# Patient Record
Sex: Female | Born: 1985 | Race: Black or African American | Hispanic: No | Marital: Single | Smoking: Current every day smoker
Health system: Southern US, Community
[De-identification: ages and names within clinical notes are randomized; demographics above are authoritative.]

---

## 2005-01-30 ENCOUNTER — Ambulatory Visit: Payer: Self-pay | Admitting: Internal Medicine

## 2005-03-07 ENCOUNTER — Emergency Department (HOSPITAL_COMMUNITY): Admission: EM | Admit: 2005-03-07 | Discharge: 2005-03-07 | Payer: Self-pay | Admitting: Emergency Medicine

## 2005-03-13 ENCOUNTER — Emergency Department (HOSPITAL_COMMUNITY): Admission: EM | Admit: 2005-03-13 | Discharge: 2005-03-14 | Payer: Self-pay | Admitting: Emergency Medicine

## 2017-02-22 ENCOUNTER — Emergency Department (HOSPITAL_COMMUNITY)
Admission: EM | Admit: 2017-02-22 | Discharge: 2017-02-22 | Disposition: A | Discharge status: Home / Self Care | Payer: Commercial Managed Care - HMO | Attending: Emergency Medicine | Admitting: Emergency Medicine

## 2017-02-22 ENCOUNTER — Encounter (HOSPITAL_COMMUNITY): Payer: Self-pay

## 2017-02-22 DIAGNOSIS — K0889 Other specified disorders of teeth and supporting structures: Secondary | ICD-10-CM | POA: Diagnosis present

## 2017-02-22 MED ORDER — LIDOCAINE VISCOUS 2 % MT SOLN
15.0000 mL | Freq: Once | OROMUCOSAL | Status: AC
  Administered 2017-02-22: 15 mL via OROMUCOSAL
  Filled 2017-02-22: qty 15

## 2017-02-22 MED ORDER — PENICILLIN V POTASSIUM 500 MG PO TABS: 500.0000 mg | ORAL_TABLET | Freq: Four times a day (QID) | ORAL | 0 refills | Status: AC

## 2017-02-22 MED ORDER — IBUPROFEN 800 MG PO TABS: 800.0000 mg | ORAL_TABLET | Freq: Three times a day (TID) | ORAL | 0 refills | Status: AC

## 2017-02-22 MED ORDER — BUPIVACAINE-EPINEPHRINE (PF) 0.5% -1:200000 IJ SOLN
1.8000 mL | Freq: Once | INTRAMUSCULAR | Status: AC
  Administered 2017-02-22: 1.8 mL
  Filled 2017-02-22: qty 1.8

## 2017-02-22 NOTE — ED Notes (Signed)
Pt departed in NAD, refused use of wheelchair.  

## 2017-02-22 NOTE — ED Notes (Signed)
ED Provider at bedside. 

## 2017-02-22 NOTE — ED Triage Notes (Signed)
Pt complaining of L lower dental pain x 1 week. Pt complaining of broken & missing teeth.

## 2017-02-22 NOTE — ED Notes (Signed)
Pt reports having taken  ibuprofen at home, approx 2000 tonight. Pt presents as anxious and tachypneic.

## 2017-02-22 NOTE — ED Provider Notes (Signed)
[facility]-EMERGENCY DEPT Provider Note   CSN: [id number] Arrival date & time: 02/22/17  0013     History   Chief Complaint Chief Complaint  Patient presents with  . Dental Pain    HPI Monica Owens is a 31 y.o. female who presents today with 1 week of progressively worsening left lower dental pain. Pain is sharp, constant, and does not radiate. Eating makes it worse. Ibuprofen has been minimally helpful. She states she has had this pain intermittently for 1 year now with known cracked tooth but it usually resolves. With associated HA and left sided ear pain. She has an appointment with dentist on Wednesday. Denies facial swelling, drainage, coughing, sneezing, congestion, CP/SOB, abd pain, n/v/d.   HPI  No past medical history on file.  There are no active problems to display for this patient.   No past surgical history on file.  OB History    No data available       Home Medications    Prior to Admission medications   Medication Sig Start Date End Date Taking? Authorizing Provider  ibuprofen (ADVIL,MOTRIN) 800 MG tablet Take 1 tablet (800 mg total) by mouth 3 (three) times daily. 02/22/17 02/26/17  Sacha Topor A Dearra Myhand, PA-C  penicillin v potassium (VEETID) 500 MG tablet Take 1 tablet (500 mg total) by mouth 4 (four) times daily. 02/22/17 03/01/17  Jeanie Sewer, PA-C    Family History No family history on file.  Social History Social History  Substance Use Topics  . Smoking status: Never Smoker  . Smokeless tobacco: Never Used  . Alcohol use Yes     Allergies   Patient has no allergy information on record.   Review of Systems Review of Systems  Constitutional: Negative for chills and fever.  HENT: Positive for dental problem and ear pain. Negative for facial swelling, sneezing, sore throat and trouble swallowing.   Respiratory: Negative for cough and shortness of breath.   Cardiovascular: Negative for chest pain.  Gastrointestinal: Negative for abdominal pain, diarrhea,  nausea and vomiting.  Neurological: Positive for headaches. Negative for numbness.     Physical Exam Updated Vital Signs BP 133/73 (BP Location: Right Arm)   Pulse 60   Temp 99 F (37.2 C) (Oral)   Resp 20   LMP 02/02/2017 (Approximate)   SpO2 100%   Physical Exam  Constitutional: She is oriented to person, place, and time. She appears well-developed and well-nourished. No distress.  HENT:  Head: Normocephalic and atraumatic.  Mouth/Throat: Uvula is midline and oropharynx is clear and moist. Abnormal dentition. Dental caries present. No oropharyngeal exudate.    TMs normal bl without erythema or bulging. No posterior pharynx erythema, tonsillar swelling, or uvular deviation. Cracked left first molar. Poor denition throughout with multiple dental carries and missing teeth. Pink gingiva without evidence of inflammation. No fluctuance. Normal buccal mucosa.   Eyes: Conjunctivae are normal. Right eye exhibits no discharge. Left eye exhibits no discharge. No scleral icterus.  Neck: Neck supple. No JVD present. No tracheal deviation present. No thyromegaly present.  Cardiovascular: Normal rate.   Pulmonary/Chest: Effort normal.  Abdominal: She exhibits no distension.  Musculoskeletal: Normal range of motion.  Lymphadenopathy:    She has no cervical adenopathy.  Neurological: She is alert and oriented to person, place, and time.  Skin: Skin is warm and dry. She is not diaphoretic.  Psychiatric: She has a normal mood and affect. Her behavior is normal.     ED Treatments / Results  Labs (all  labs ordered are listed, but only abnormal results are displayed) Labs Reviewed - No data to display  EKG  EKG Interpretation None       Radiology No results found.  Procedures Dental Block Date/Time: 02/22/2017 1:26 AM Performed by: Michela Pitcher A Authorized by: Michela Pitcher A   Consent:    Consent obtained:  Verbal   Consent given by:  Patient   Risks discussed:  Pain,  unsuccessful block and infection   Alternatives discussed:  No treatment and alternative treatment Indications:    Indications: dental pain   Procedure details (see MAR for exact dosages):    Topical anesthetic:  Benzocaine spray and lidocaine gel   Needle gauge:  27 G   Anesthetic injected:  Bupivacaine 0.5% WITH epi   Injection procedure:  Anatomic landmarks identified, introduced needle, negative aspiration for blood, incremental injection and anatomic landmarks palpated Post-procedure details:    Outcome:  Anesthesia achieved   Patient tolerance of procedure:  Tolerated well, no immediate complications   (including critical care time)  Medications Ordered in ED Medications  bupivacaine-epinephrine (MARCAINE W/ EPI) 0.5% -1:200000 injection 1.8 mL (1.8 mLs Infiltration Given 02/22/17 0053)  lidocaine (XYLOCAINE) 2 % viscous mouth solution 15 mL (15 mLs Mouth/Throat Given 02/22/17 0052)     Initial Impression / Assessment and Plan / ED Course  I have reviewed the triage vital signs and the nursing notes.  Pertinent labs & imaging results that were available during my care of the patient were reviewed by me and considered in my medical decision making (see chart for details).     30yof presents to ED with 1 week progressively worsening dental pain with known cracked tooth. Pt afebrile, hypertensive on arrival but normotensive on re-evaluation and management of pain. VS otherwise stable. Obvious cracked tooth on left lower mandible. No gross abscess.  Exam unconcerning for Ludwig's angina or spread of infection. Pt requesting pain control, offered dental block. Obtained verbal consent, administered marcaine w/epi with relief of pain; pt tolerated procedure well with no immediate complications. Will treat with penicillin and ibuprofen for pain at home and discussed use of ice packs for comfort.  Encouraged pt to keep follow up appt with dentist on Wednesday. Discussed ED return precautions.  Pt verbalized understanding and agreement with plan and is safe for discharge at this time.   Final Clinical Impressions(s) / ED Diagnoses   Final diagnoses:  Pain, dental    New Prescriptions Discharge Medication List as of 02/22/2017  1:02 AM    START taking these medications   Details  ibuprofen (ADVIL,MOTRIN) 800 MG tablet Take 1 tablet (800 mg total) by mouth 3 (three) times daily., Starting Sun 02/22/2017, Until Thu 02/26/2017, Print    penicillin v potassium (VEETID) 500 MG tablet Take 1 tablet (500 mg total) by mouth 4 (four) times daily., Starting Sun 02/22/2017, Until Sun 03/01/2017, Print         Jeanie Sewer, PA-C 02/22/17 0157    Jeanie Sewer, PA-C 02/22/17 0158    Melene Plan, DO 02/22/17 1610

## 2018-10-26 ENCOUNTER — Emergency Department (HOSPITAL_COMMUNITY)
Admission: EM | Admit: 2018-10-26 | Discharge: 2018-10-26 | Disposition: A | Discharge status: Home / Self Care | Payer: Medicaid Other | Attending: Emergency Medicine | Admitting: Emergency Medicine

## 2018-10-26 ENCOUNTER — Encounter (HOSPITAL_COMMUNITY): Payer: Self-pay | Admitting: Emergency Medicine

## 2018-10-26 DIAGNOSIS — F17210 Nicotine dependence, cigarettes, uncomplicated: Secondary | ICD-10-CM | POA: Insufficient documentation

## 2018-10-26 DIAGNOSIS — R112 Nausea with vomiting, unspecified: Secondary | ICD-10-CM | POA: Diagnosis present

## 2018-10-26 DIAGNOSIS — R197 Diarrhea, unspecified: Secondary | ICD-10-CM | POA: Insufficient documentation

## 2018-10-26 DIAGNOSIS — R109 Unspecified abdominal pain: Secondary | ICD-10-CM | POA: Diagnosis not present

## 2018-10-26 LAB — CBC
HCT: 42.6 % (ref 36.0–46.0)
HEMOGLOBIN: 13.4 g/dL (ref 12.0–15.0)
MCH: 27.5 pg (ref 26.0–34.0)
MCHC: 31.5 g/dL (ref 30.0–36.0)
MCV: 87.3 fL (ref 80.0–100.0)
PLATELETS: 306 10*3/uL (ref 150–400)
RBC: 4.88 MIL/uL (ref 3.87–5.11)
RDW: 13.8 % (ref 11.5–15.5)
WBC: 16.3 10*3/uL — ABNORMAL HIGH (ref 4.0–10.5)
nRBC: 0 % (ref 0.0–0.2)

## 2018-10-26 LAB — COMPREHENSIVE METABOLIC PANEL
ALBUMIN: 4.5 g/dL (ref 3.5–5.0)
ALK PHOS: 86 U/L (ref 38–126)
ALT: 15 U/L (ref 0–44)
AST: 15 U/L (ref 15–41)
Anion gap: 8 (ref 5–15)
BILIRUBIN TOTAL: 0.9 mg/dL (ref 0.3–1.2)
BUN: 14 mg/dL (ref 6–20)
CALCIUM: 9.3 mg/dL (ref 8.9–10.3)
CO2: 29 mmol/L (ref 22–32)
CREATININE: 0.71 mg/dL (ref 0.44–1.00)
Chloride: 105 mmol/L (ref 98–111)
GFR calc Af Amer: >60 mL/min (ref >60)
GFR calc non Af Amer: >60 mL/min (ref >60)
GLUCOSE: 101 mg/dL — AB (ref 70–99)
Potassium: 3.9 mmol/L (ref 3.5–5.1)
Sodium: 142 mmol/L (ref 135–145)
TOTAL PROTEIN: 7.5 g/dL (ref 6.5–8.1)

## 2018-10-26 LAB — URINALYSIS, ROUTINE W REFLEX MICROSCOPIC
BILIRUBIN URINE: NEGATIVE
Glucose, UA: NEGATIVE mg/dL
HGB URINE DIPSTICK: NEGATIVE
Ketones, ur: NEGATIVE mg/dL
Leukocytes, UA: NEGATIVE
Nitrite: NEGATIVE
Protein, ur: NEGATIVE mg/dL
SPECIFIC GRAVITY, URINE: 1.024 (ref 1.005–1.030)
pH: 8 (ref 5.0–8.0)

## 2018-10-26 LAB — I-STAT BETA HCG BLOOD, ED (MC, WL, AP ONLY)

## 2018-10-26 LAB — LIPASE, BLOOD: Lipase: 29 U/L (ref 11–51)

## 2018-10-26 MED ORDER — ONDANSETRON HCL 4 MG PO TABS: 4.0000 mg | ORAL_TABLET | Freq: Three times a day (TID) | ORAL | 0 refills | Status: AC | PRN

## 2018-10-26 MED ORDER — SODIUM CHLORIDE 0.9 % IV BOLUS
500.0000 mL | Freq: Once | INTRAVENOUS | Status: AC
  Administered 2018-10-26: 500 mL via INTRAVENOUS

## 2018-10-26 MED ORDER — ONDANSETRON HCL 4 MG/2ML IJ SOLN
4.0000 mg | Freq: Once | INTRAMUSCULAR | Status: AC
  Administered 2018-10-26: 4 mg via INTRAVENOUS
  Filled 2018-10-26: qty 2

## 2018-10-26 NOTE — ED Notes (Signed)
Offered patient fluids per PA. Pt refused. Pt stated she had her own ginger ale, and has been drinking it since she arrived to the ED. Pt reported only being able to take small sips, but was able to keep it down. Encouraged pt to continue drinking.

## 2018-10-26 NOTE — ED Provider Notes (Signed)
[facility]-EMERGENCY DEPT Provider Note   CSN: [id number] Arrival date & time: 10/26/18  [id number]     History   Chief Complaint Chief Complaint  Patient presents with  . Abdominal Pain  . Emesis  . Diarrhea    HPI Monica Owens is a 32 y.o. female.  HPI   Pt is a 32 y/o female with no significant past medical history who presents to the emergency department for evaluation of nausea, vomiting (x6) and diarrhea (x5) that began this morning.   She reports abd pain that feels crampy. Pain is located to the periumbilical area. Pain is worse when she stands up, but it improves when she lays down. Rates pain at 7/10.  She reports an episode of blood tinged vomit. States that her bowel movements have been green. Denies hematochezia or melena. Denies fevers, dysuria, hematuria, frequency, vaginal discharge or vaginal bleeding.   She denies drug or ETOH use. Denies sick contacts. Denies eating an uncooked or spoiled food. No recent trips out of the country.   History reviewed. No pertinent past medical history.  There are no active problems to display for this patient.   History reviewed. No pertinent surgical history.   OB History   None      Home Medications    Prior to Admission medications   Medication Sig Start Date End Date Taking? Authorizing Provider  cetirizine (ZYRTEC) 10 MG tablet Take 10 mg by mouth at bedtime as needed for allergies.  08/25/18  Yes [provider]  hydrOXYzine (VISTARIL) 25 MG capsule Take 25 mg by mouth 3 (three) times daily as needed for anxiety.  08/25/18  Yes [provider]  ondansetron (ZOFRAN) 4 MG tablet Take 1 tablet (4 mg total) by mouth every 8 (eight) hours as needed for nausea or vomiting. 10/26/18   Finnian Husted S, PA-C    Family History No family history on file.  Social History Social History   Tobacco Use  . Smoking status: Current Every Day Smoker    Types: Cigarettes  .  Smokeless tobacco: Never Used  Substance Use Topics  . Alcohol use: Yes  . Drug use: Yes    Types: Marijuana     Allergies   Latex   Review of Systems Review of Systems  Constitutional: Negative for chills and fever.  HENT: Negative for ear pain and sore throat.   Eyes: Negative for pain and visual disturbance.  Respiratory: Negative for cough and shortness of breath.   Cardiovascular: Negative for chest pain.  Gastrointestinal: Positive for abdominal pain, diarrhea, nausea and vomiting. Negative for constipation.  Genitourinary: Negative for dysuria and hematuria.  Musculoskeletal: Negative for back pain.  Skin: Negative for color change and rash.  Neurological: Negative for headaches.  All other systems reviewed and are negative.   Physical Exam Updated Vital Signs BP 114/88   Pulse 83   Temp 98.3 F (36.8 C) (Oral)   Resp 16   LMP 10/25/2018   SpO2 100%   Physical Exam  Constitutional: She appears well-developed and well-nourished. No distress.  HENT:  Head: Normocephalic and atraumatic.  Eyes: Conjunctivae are normal.  Neck: Neck supple.  Cardiovascular: Normal rate and regular rhythm.  No murmur heard. Pulmonary/Chest: Effort normal and breath sounds normal. No respiratory distress.  Abdominal: Soft. Bowel sounds are normal. There is tenderness (mild, llq). There is no rigidity, no rebound, no guarding and no CVA tenderness.  Musculoskeletal: She exhibits no edema.  Neurological: She is alert.  Skin: Skin is warm and dry.  Psychiatric: She has a normal mood and affect.  Nursing note and vitals reviewed.   ED Treatments / Results  Labs (all labs ordered are listed, but only abnormal results are displayed) Labs Reviewed  COMPREHENSIVE METABOLIC PANEL - Abnormal; Notable for the following components:      Result Value   Glucose, Bld 101 (*)    All other components within normal limits  CBC - Abnormal; Notable for the following components:   WBC 16.3  (*)    All other components within normal limits  LIPASE, BLOOD  URINALYSIS, ROUTINE W REFLEX MICROSCOPIC  I-STAT BETA HCG BLOOD, ED ([facility], [facility], [facility] ONLY)    EKG None  Radiology No results found.  Procedures Procedures (including critical care time)  Medications Ordered in ED Medications  sodium chloride 0.9 % bolus 500 mL (500 mLs Intravenous New Bag/Given 10/26/18 1419)  ondansetron (ZOFRAN) injection 4 mg (4 mg Intravenous Given 10/26/18 1419)     Initial Impression / Assessment and Plan / ED Course  I have reviewed the triage vital signs and the nursing notes.  Pertinent labs & imaging results that were available during my care of the patient were reviewed by me and considered in my medical decision making (see chart for details).    Final Clinical Impressions(s) / ED Diagnoses   Final diagnoses:  Nausea vomiting and diarrhea   Patient with symptoms consistent with viral gastroenteritis.  Vitals are stable, no fever.  No signs of dehydration, tolerating PO fluids > 6 oz.  Lungs are clear. low concern for appendicitis, cholecystitis, pancreatitis, ruptured viscus, UTI, kidney stone, or any other abdominal etiology.  She does have some mild left lower quadrant tenderness but she has no rebound tenderness no guarding or rigidity.  With her young age, have lower suspicion for diverticulitis however discussed strict return precautions with her.  Labs are grossly reassuring with exception of mild leukocytosis, feel that this is likely secondary to her current viral illness.  Supportive therapy indicated with return if symptoms worsen.  Patient counseled.   ED Discharge Orders         Ordered    ondansetron (ZOFRAN) 4 MG tablet  Every 8 hours PRN     10/26/18 [id number] Griffith Santilli S, PA-C 10/26/18 1543    Geoffery Lyons, MD 10/27/18 [phone]

## 2018-10-26 NOTE — Discharge Instructions (Addendum)

## 2018-10-26 NOTE — ED Triage Notes (Signed)
Per pt, states vomiting and diarrhea since 2 am-abdominal pain as well

## 2019-07-25 ENCOUNTER — Emergency Department (HOSPITAL_COMMUNITY): Payer: Medicaid Other

## 2019-07-25 ENCOUNTER — Other Ambulatory Visit: Payer: Self-pay

## 2019-07-25 ENCOUNTER — Emergency Department (HOSPITAL_COMMUNITY)
Admission: EM | Admit: 2019-07-25 | Discharge: 2019-07-25 | Disposition: A | Discharge status: Home / Self Care | Payer: Medicaid Other | Attending: Emergency Medicine | Admitting: Emergency Medicine

## 2019-07-25 ENCOUNTER — Encounter (HOSPITAL_COMMUNITY): Payer: Self-pay | Admitting: Emergency Medicine

## 2019-07-25 DIAGNOSIS — Z91040 Latex allergy status: Secondary | ICD-10-CM | POA: Insufficient documentation

## 2019-07-25 DIAGNOSIS — Z79899 Other long term (current) drug therapy: Secondary | ICD-10-CM | POA: Insufficient documentation

## 2019-07-25 DIAGNOSIS — S93401A Sprain of unspecified ligament of right ankle, initial encounter: Secondary | ICD-10-CM | POA: Diagnosis not present

## 2019-07-25 DIAGNOSIS — X58XXXA Exposure to other specified factors, initial encounter: Secondary | ICD-10-CM | POA: Insufficient documentation

## 2019-07-25 DIAGNOSIS — Y999 Unspecified external cause status: Secondary | ICD-10-CM | POA: Insufficient documentation

## 2019-07-25 DIAGNOSIS — S8391XA Sprain of unspecified site of right knee, initial encounter: Secondary | ICD-10-CM | POA: Diagnosis not present

## 2019-07-25 DIAGNOSIS — Y92838 Other recreation area as the place of occurrence of the external cause: Secondary | ICD-10-CM | POA: Insufficient documentation

## 2019-07-25 DIAGNOSIS — F17210 Nicotine dependence, cigarettes, uncomplicated: Secondary | ICD-10-CM | POA: Insufficient documentation

## 2019-07-25 DIAGNOSIS — S8991XA Unspecified injury of right lower leg, initial encounter: Secondary | ICD-10-CM | POA: Diagnosis present

## 2019-07-25 DIAGNOSIS — Y9319 Activity, other involving water and watercraft: Secondary | ICD-10-CM | POA: Diagnosis not present

## 2019-07-25 MED ORDER — OXYCODONE-ACETAMINOPHEN 5-325 MG PO TABS
1.0000 | ORAL_TABLET | Freq: Once | ORAL | Status: AC
  Administered 2019-07-25: 1 via ORAL
  Filled 2019-07-25: qty 1

## 2019-07-25 MED ORDER — KETOROLAC TROMETHAMINE 60 MG/2ML IM SOLN
60.0000 mg | Freq: Once | INTRAMUSCULAR | Status: AC
Start: 2019-07-25 — End: 2019-07-25
  Administered 2019-07-25: 09:00:00 60 mg via INTRAMUSCULAR
  Filled 2019-07-25: qty 2
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
## 2019-07-25 NOTE — Progress Notes (Signed)
Orthopedic Tech Progress Note Patient Details:  Monica Owens [birth year] [id number]  Ortho Devices Type of Ortho Device: Knee Sleeve, Crutches, Ankle Air splint Ortho Device/Splint Interventions: Adjustment, Application, Ordered   Post Interventions Patient Tolerated: Well Instructions Provided: Care of device, Poper ambulation with device, Adjustment of device   [name] T 07/25/2019, 9:52 AM
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
## 2019-07-25 NOTE — ED Notes (Signed)
PT returned from outside.

## 2019-07-25 NOTE — Discharge Instructions (Signed)
Use the crutches as long as you need to if it is very painful to walk.  Wear the braces for the next 1 to 2 weeks based on your symptoms of pain.  Avoid any activities where you could potentially injure your leg again such as jet skiing, boating, bicycle riding or riding scooters.  Take 2 extra strength Tylenol with your prescription ibuprofen every 6 hours for pain as well as using ice for 20 minutes at a time to the knee and ankle/foot area.

## 2019-07-25 NOTE — ED Notes (Signed)
PT had visitor come roll her outside in wheelchair.  Informed her that she is getting ready to go to triage room and she states she will be back.

## 2020-10-29 IMAGING — DX DG KNEE COMPLETE 4+V*R*
4 series · 4 of 4 positions shown · non-contrast
Comparison: None.

CLINICAL DATA: Injury of the right knee

EXAM:
RIGHT KNEE - COMPLETE 4+ VIEW

[knee ap]
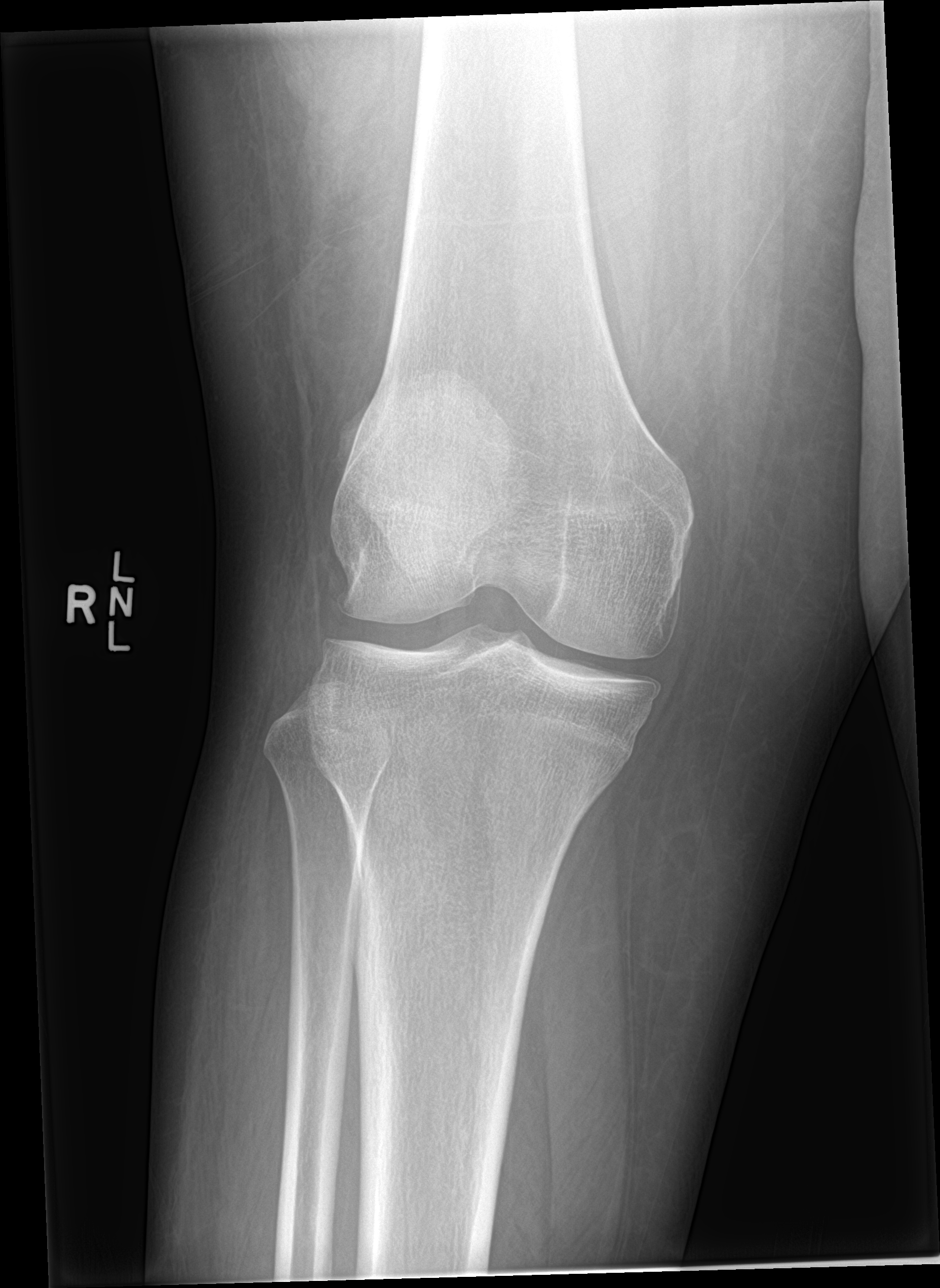

[tunnel]
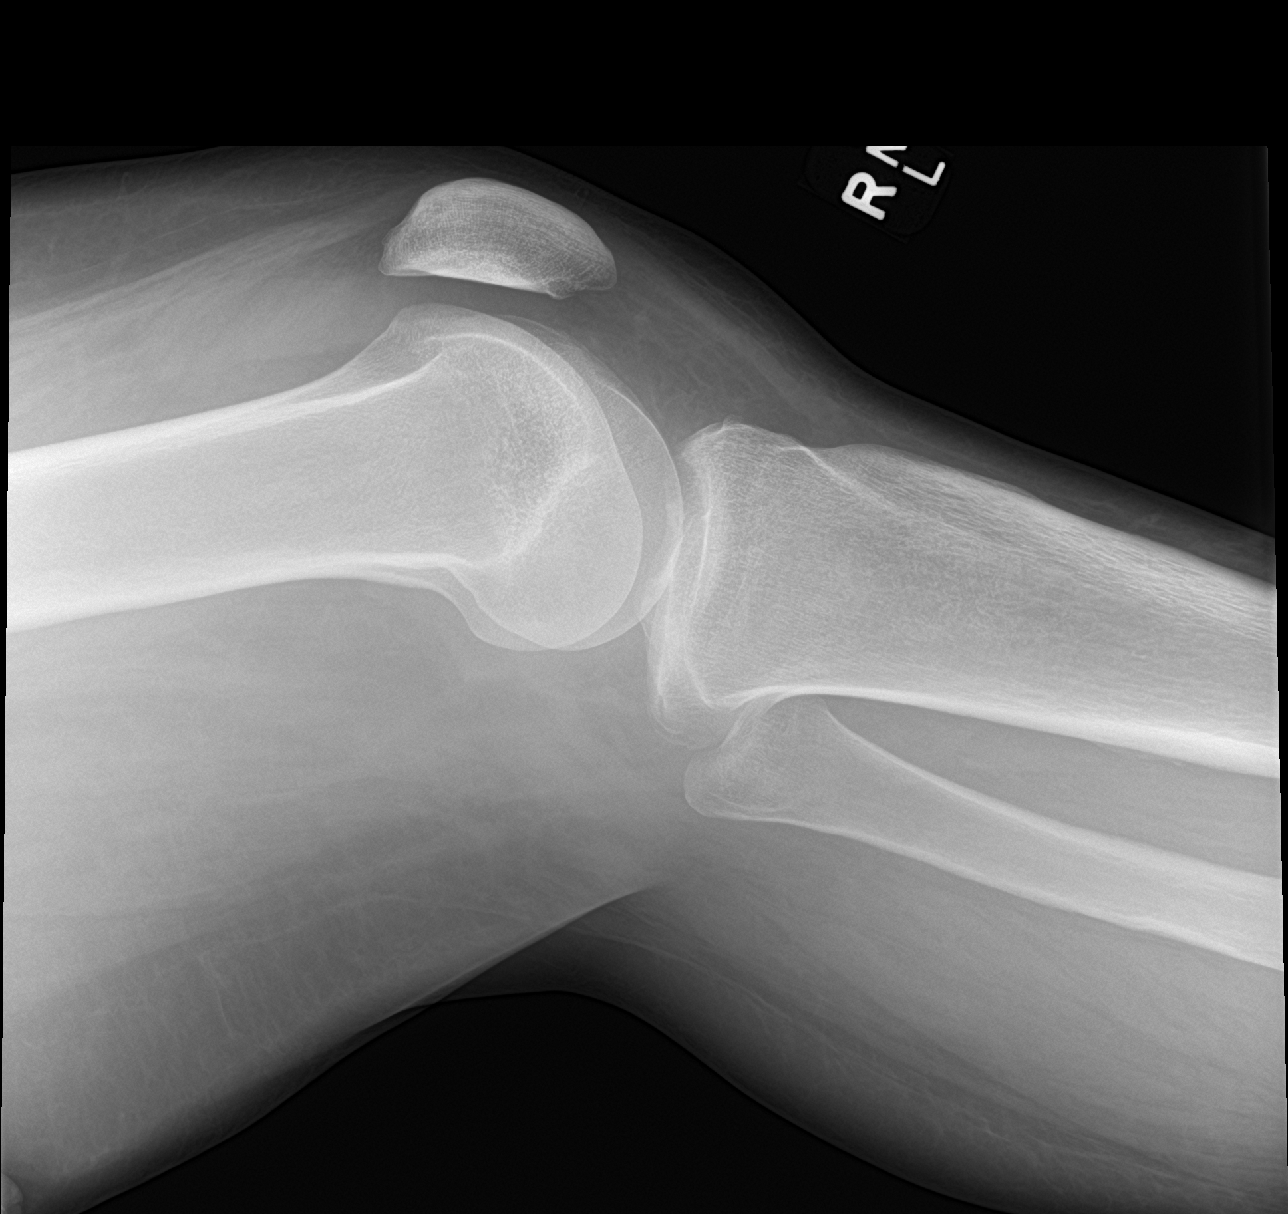

[knee obl (1 of 2)]
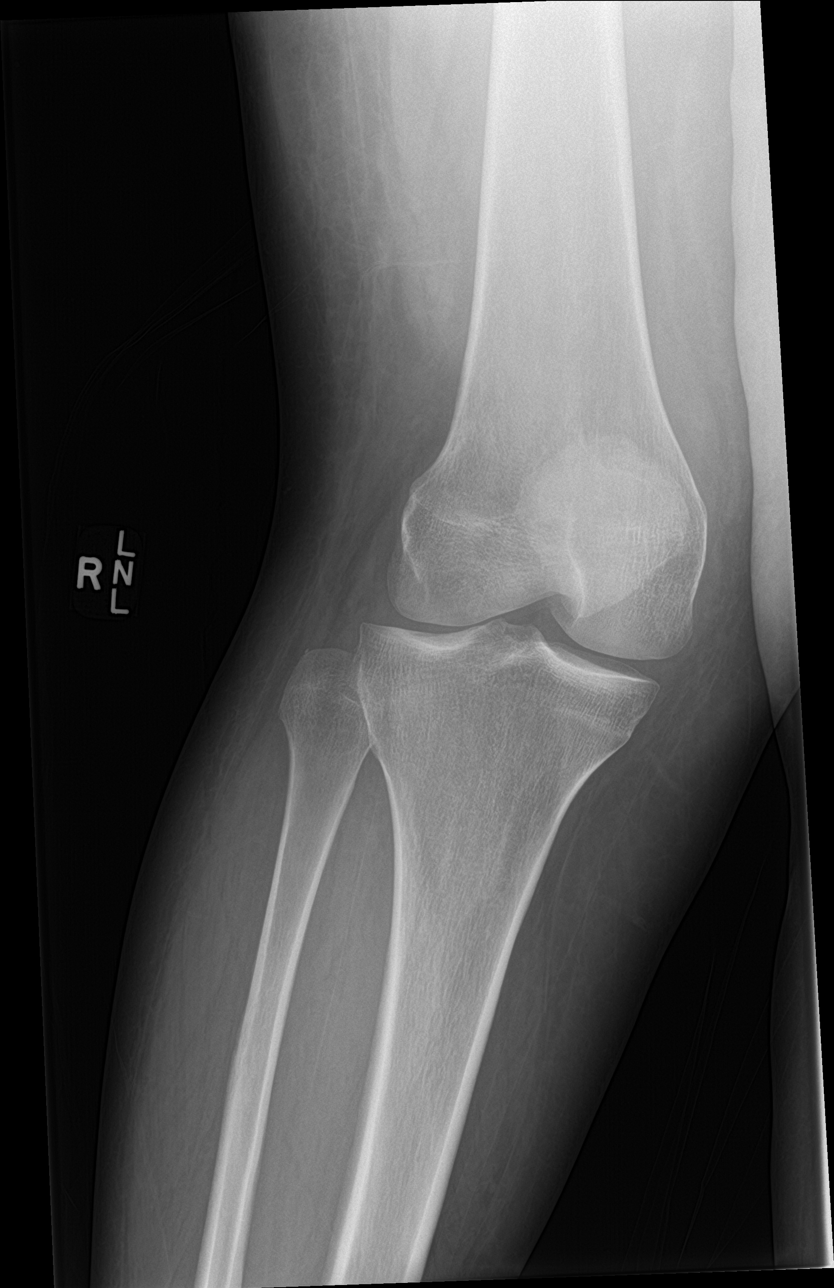

[knee obl (2 of 2)]
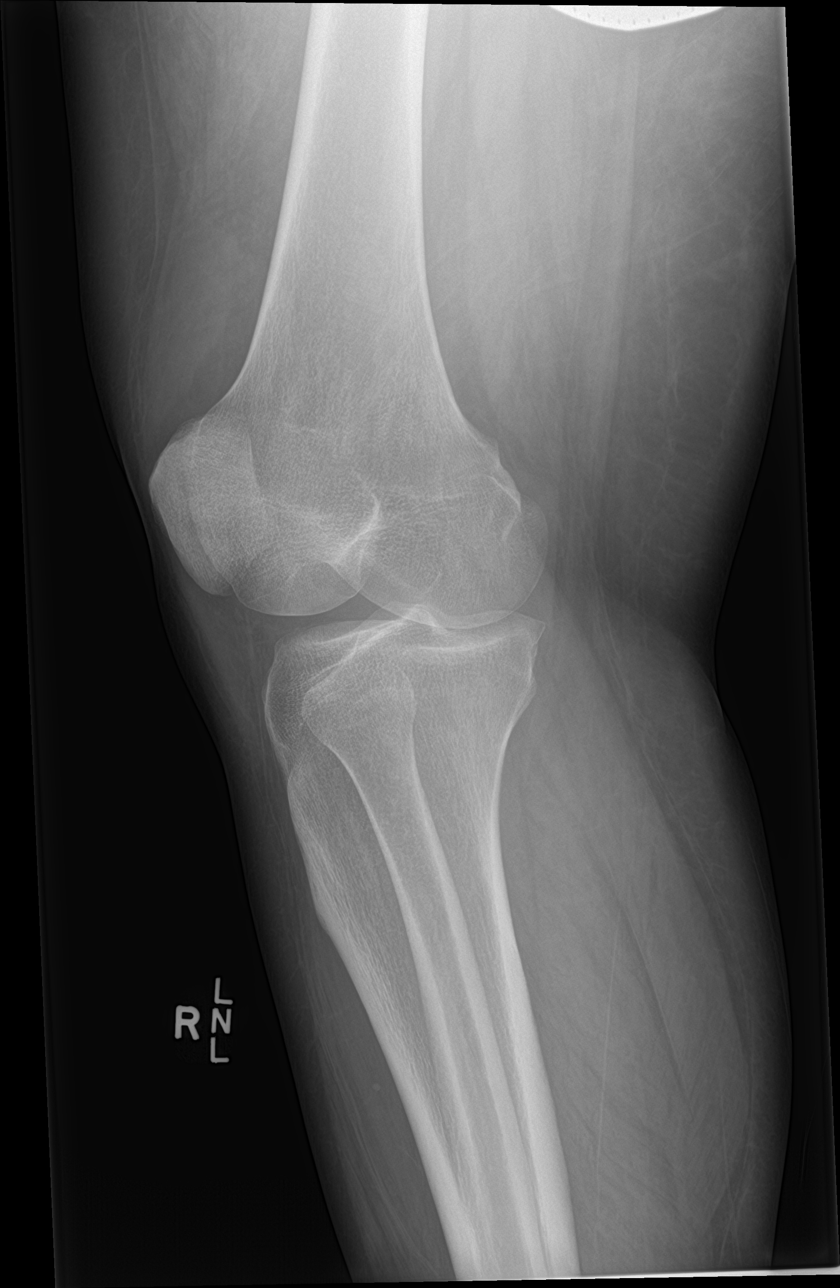

[4 of 4 positions shown; findings below may reference images not displayed]

FINDINGS: No evidence of fracture, dislocation, or joint effusion. No evidence
of arthropathy or other focal bone abnormality. Soft tissues are
unremarkable.
IMPRESSION: No acute osseous injury.

## 2021-03-24 ENCOUNTER — Emergency Department (HOSPITAL_BASED_OUTPATIENT_CLINIC_OR_DEPARTMENT_OTHER)
Admission: EM | Admit: 2021-03-24 | Discharge: 2021-03-24 | Disposition: A | Discharge status: Home / Self Care | Payer: Medicaid Other | Attending: Emergency Medicine | Admitting: Emergency Medicine

## 2021-03-24 ENCOUNTER — Encounter (HOSPITAL_BASED_OUTPATIENT_CLINIC_OR_DEPARTMENT_OTHER): Payer: Self-pay | Admitting: Emergency Medicine

## 2021-03-24 ENCOUNTER — Other Ambulatory Visit: Payer: Self-pay

## 2021-03-24 DIAGNOSIS — Z23 Encounter for immunization: Secondary | ICD-10-CM | POA: Diagnosis not present

## 2021-03-24 DIAGNOSIS — W268XXA Contact with other sharp object(s), not elsewhere classified, initial encounter: Secondary | ICD-10-CM | POA: Diagnosis not present

## 2021-03-24 DIAGNOSIS — Y93G3 Activity, cooking and baking: Secondary | ICD-10-CM | POA: Diagnosis not present

## 2021-03-24 DIAGNOSIS — Z91040 Latex allergy status: Secondary | ICD-10-CM | POA: Insufficient documentation

## 2021-03-24 DIAGNOSIS — S61011A Laceration without foreign body of right thumb without damage to nail, initial encounter: Secondary | ICD-10-CM | POA: Diagnosis not present

## 2021-03-24 DIAGNOSIS — F17210 Nicotine dependence, cigarettes, uncomplicated: Secondary | ICD-10-CM | POA: Insufficient documentation

## 2021-03-24 DIAGNOSIS — S6991XA Unspecified injury of right wrist, hand and finger(s), initial encounter: Secondary | ICD-10-CM | POA: Diagnosis present

## 2021-03-24 MED ORDER — TETANUS-DIPHTH-ACELL PERTUSSIS 5-2.5-18.5 LF-MCG/0.5 IM SUSY
0.5000 mL | PREFILLED_SYRINGE | Freq: Once | INTRAMUSCULAR | Status: AC
  Administered 2021-03-24: 0.5 mL via INTRAMUSCULAR
  Filled 2021-03-24: qty 0.5

## 2021-03-24 MED ORDER — BACITRACIN ZINC 500 UNIT/GM EX OINT
TOPICAL_OINTMENT | Freq: Once | CUTANEOUS | Status: AC
  Administered 2021-03-24: 1 via TOPICAL
  Filled 2021-03-24: qty 28.35

## 2021-03-24 MED ORDER — LIDOCAINE HCL (PF) 1 % IJ SOLN
5.0000 mL | Freq: Once | INTRAMUSCULAR | Status: AC
  Administered 2021-03-24: 5 mL via INTRADERMAL
  Filled 2021-03-24: qty 5

## 2021-03-24 MED ORDER — OXYCODONE-ACETAMINOPHEN 5-325 MG PO TABS
1.0000 | ORAL_TABLET | Freq: Once | ORAL | Status: AC
  Administered 2021-03-24: 1 via ORAL
  Filled 2021-03-24: qty 1

## 2021-03-24 NOTE — ED Provider Notes (Signed)
[facility] EMERGENCY DEPARTMENT Provider Note   CSN: [id number] Arrival date & time: 03/24/21  1817     History Chief Complaint  Patient presents with  . Laceration    Monica Owens is a 35 y.o. female who presents for evaluation of laceration to right thumb.  She reports earlier this evening, she was cooking dinner and states she was opening a can of EMs when she accidentally against the can.  Reports a laceration to the lateral aspect of her thumb.  Does not know when her last tetanus shot was.  Denies any numbness/weakness.  The history is provided by the patient.       History reviewed. No pertinent past medical history.  There are no problems to display for this patient.   History reviewed. No pertinent surgical history.   OB History   No obstetric history on file.     History reviewed. No pertinent family history.  Social History   Tobacco Use  . Smoking status: Current Every Day Smoker    Types: Cigarettes  . Smokeless tobacco: Never Used  Substance Use Topics  . Alcohol use: Yes  . Drug use: Yes    Types: Marijuana    Home Medications Prior to Admission medications   Medication Sig Start Date End Date Taking? Authorizing Provider  cetirizine (ZYRTEC) 10 MG tablet Take 10 mg by mouth at bedtime as needed for allergies.  08/25/18   [provider]  hydrOXYzine (VISTARIL) 25 MG capsule Take 25 mg by mouth 3 (three) times daily as needed for anxiety.  08/25/18   [provider]  ondansetron (ZOFRAN) 4 MG tablet Take 1 tablet (4 mg total) by mouth every 8 (eight) hours as needed for nausea or vomiting. 10/26/18   Couture, Cortni S, PA-C    Allergies    Latex  Review of Systems   Review of Systems  Skin: Positive for wound.  Neurological: Negative for weakness and numbness.  All other systems reviewed and are negative.   Physical Exam Updated Vital Signs BP 139/67 (BP Location: Left Arm)   Pulse 94   Temp 98.4 F  (36.9 C) (Oral)   Resp 20   Ht 5\' 11"  (1.803 m)   Wt 106.6 kg   LMP 03/21/2021   SpO2 99%   BMI 32.78 kg/m   Physical Exam Vitals and nursing note reviewed.  Constitutional:      Appearance: She is well-developed.  HENT:     Head: Normocephalic and atraumatic.  Eyes:     General: No scleral icterus.       Right eye: No discharge.        Left eye: No discharge.     Conjunctiva/sclera: Conjunctivae normal.  Cardiovascular:     Pulses:          Radial pulses are 2+ on the right side and 2+ on the left side.  Pulmonary:     Effort: Pulmonary effort is normal.  Musculoskeletal:     Comments: Full flexion/active right thumb intact any difficulty.  Skin:    General: Skin is warm and dry.     Comments: Good distal cap refill. RUE is not dusky in appearance or cool to touch.  There is a 0.5 cm curvilinear laceration that begins at the lateral nail bed of the right thumb on the ulnar aspect and extends medially.  It does not involve the nail and nail is intact.  Neurological:     Mental Status: She is alert.  Comments: Sensation intact along major nerve distributions of BUE  Psychiatric:        Speech: Speech normal.        Behavior: Behavior normal.     ED Results / Procedures / Treatments   Labs (all labs ordered are listed, but only abnormal results are displayed) Labs Reviewed - No data to display  EKG None  Radiology No results found.  Procedures .Marland KitchenLaceration Repair  Date/Time: 03/24/2021 7:49 PM Performed by: Maxwell Caul, PA-C Authorized by: Maxwell Caul, PA-C   Consent:    Consent obtained:  Verbal   Consent given by:  Patient   Risks discussed:  Infection, need for additional repair, pain, poor cosmetic result and poor wound healing   Alternatives discussed:  No treatment and delayed treatment Universal protocol:    Procedure explained and questions answered to patient or proxy's satisfaction: yes     Relevant documents present and verified:  yes     Test results available: yes     Imaging studies available: yes     Required blood products, implants, devices, and special equipment available: yes     Site/side marked: yes     Immediately prior to procedure, a time out was called: yes     Patient identity confirmed:  Verbally with patient Anesthesia:    Anesthesia method:  Local infiltration   Local anesthetic:  Lidocaine 1% w/o epi Laceration details:    Location:  Finger   Finger location:  R thumb   Length (cm):  0.5 Pre-procedure details:    Preparation:  Patient was prepped and draped in usual sterile fashion Exploration:    Hemostasis achieved with:  Direct pressure   Wound exploration: wound explored through full range of motion     Wound extent: no foreign bodies/material noted   Treatment:    Area cleansed with:  Povidone-iodine   Amount of cleaning:  Extensive   Irrigation solution:  Sterile saline Skin repair:    Repair method:  Sutures   Suture size:  5-0   Suture material:  Prolene   Suture technique:  Simple interrupted   Number of sutures:  2 Approximation:    Approximation:  Close Repair type:    Repair type:  Simple Post-procedure details:    Dressing:  Antibiotic ointment   Procedure completion:  Tolerated     Medications Ordered in ED Medications  Tdap (BOOSTRIX) injection 0.5 mL (0.5 mLs Intramuscular Given 03/24/21 1849)  lidocaine (PF) (XYLOCAINE) 1 % injection 5 mL (5 mLs Intradermal Given by Other 03/24/21 1849)  oxyCODONE-acetaminophen (PERCOCET/ROXICET) 5-325 MG per tablet 1 tablet (1 tablet Oral Given 03/24/21 1913)  lidocaine (PF) (XYLOCAINE) 1 % injection 5 mL (5 mLs Intradermal Given by Other 03/24/21 1913)  bacitracin ointment (1 application Topical Given 03/24/21 2001)    ED Course  I have reviewed the triage vital signs and the nursing notes.  Pertinent labs & imaging results that were available during my care of the patient were reviewed by me and considered in my medical decision  making (see chart for details).    MDM Rules/Calculators/A&P                          35 year old female who presents for evaluation of right thumb laceration that occurred this evening.  Unclear when her last tetanus shot was.  No numbness/weakness.  Initial arrival, she is afebrile nontoxic-appearing.  Vital signs are stable.  On exam, she  has a 0.5 cm curvilinear laceration that starts at the nail and extends medially.  Plan for repair.  Laceration repaired as document above.  Patient tolerated procedure well.  Encouraged at home supportive care measures.  Given the small nature of the wound as well as the fact that it is only happened just prior to ED arrival, do not feel that he needs antibiotics.  At this time, patient exhibits no emergent life-threatening condition that require further evaluation in ED. Patient had ample opportunity for questions and discussion. All patient's questions were answered with full understanding. Strict return precautions discussed. Patient expresses understanding and agreement to plan.   Portions of this note were generated with Scientist, clinical (histocompatibility and immunogenetics). Dictation errors may occur despite best attempts at proofreading.  Final Clinical Impression(s) / ED Diagnoses Final diagnoses:  Laceration of right thumb without foreign body without damage to nail, initial encounter    Rx / DC Orders ED Discharge Orders    None       Rosana Hoes 03/25/21 2303    Charlynne Pander, MD 03/25/21 2326

## 2021-03-24 NOTE — ED Triage Notes (Signed)
Pt arrives pov with report of laceration by can to R thumb.

## 2021-03-24 NOTE — Discharge Instructions (Addendum)
Keep the wound clean and dry for the first 24 hours. After that you may gently clean the wound with soap and water. Make sure to pat dry the wound before covering it with any dressing. You can use topical antibiotic ointment and bandage. Ice and elevate for pain relief.  ° °You can take Tylenol or Ibuprofen as directed for pain. You can alternate Tylenol and Ibuprofen every 4 hours for additional pain relief.  ° °Return to the Emergency Department, your primary care doctor, or the [facility] in 5-7 days for suture removal.  ° °Monitor closely for any signs of infection. Return to the Emergency Department for any worsening redness/swelling of the area that begins to spread, drainage from the site, worsening pain, fever or any other worsening or concerning symptoms.  ° ° °

## 2021-04-01 DIAGNOSIS — G8929 Other chronic pain: Secondary | ICD-10-CM | POA: Diagnosis not present

## 2021-04-01 DIAGNOSIS — Z1159 Encounter for screening for other viral diseases: Secondary | ICD-10-CM | POA: Diagnosis not present

## 2021-04-01 DIAGNOSIS — Z113 Encounter for screening for infections with a predominantly sexual mode of transmission: Secondary | ICD-10-CM | POA: Diagnosis not present

## 2021-04-01 DIAGNOSIS — L84 Corns and callosities: Secondary | ICD-10-CM | POA: Diagnosis not present

## 2021-04-01 DIAGNOSIS — M25552 Pain in left hip: Secondary | ICD-10-CM | POA: Diagnosis not present

## 2021-04-01 DIAGNOSIS — Z803 Family history of malignant neoplasm of breast: Secondary | ICD-10-CM | POA: Diagnosis not present

## 2021-04-01 DIAGNOSIS — Z0000 Encounter for general adult medical examination without abnormal findings: Secondary | ICD-10-CM | POA: Diagnosis not present

## 2021-04-01 DIAGNOSIS — F410 Panic disorder [episodic paroxysmal anxiety] without agoraphobia: Secondary | ICD-10-CM | POA: Diagnosis not present

## 2021-04-01 DIAGNOSIS — M25551 Pain in right hip: Secondary | ICD-10-CM | POA: Diagnosis not present

## 2021-11-21 DIAGNOSIS — N938 Other specified abnormal uterine and vaginal bleeding: Secondary | ICD-10-CM | POA: Diagnosis not present

## 2021-11-21 DIAGNOSIS — M778 Other enthesopathies, not elsewhere classified: Secondary | ICD-10-CM | POA: Diagnosis not present

## 2021-11-21 DIAGNOSIS — J029 Acute pharyngitis, unspecified: Secondary | ICD-10-CM | POA: Diagnosis not present

## 2021-11-21 DIAGNOSIS — Z113 Encounter for screening for infections with a predominantly sexual mode of transmission: Secondary | ICD-10-CM | POA: Diagnosis not present

## 2021-12-04 DIAGNOSIS — M549 Dorsalgia, unspecified: Secondary | ICD-10-CM | POA: Diagnosis not present

## 2021-12-04 DIAGNOSIS — N898 Other specified noninflammatory disorders of vagina: Secondary | ICD-10-CM | POA: Diagnosis not present

## 2021-12-04 DIAGNOSIS — A599 Trichomoniasis, unspecified: Secondary | ICD-10-CM | POA: Diagnosis not present

## 2021-12-04 DIAGNOSIS — G8929 Other chronic pain: Secondary | ICD-10-CM | POA: Diagnosis not present

## 2022-02-11 DIAGNOSIS — E6609 Other obesity due to excess calories: Secondary | ICD-10-CM | POA: Diagnosis not present

## 2022-02-11 DIAGNOSIS — Z6836 Body mass index (BMI) 36.0-36.9, adult: Secondary | ICD-10-CM | POA: Diagnosis not present

## 2022-03-14 DIAGNOSIS — E6609 Other obesity due to excess calories: Secondary | ICD-10-CM | POA: Diagnosis not present

## 2022-03-14 DIAGNOSIS — Z6836 Body mass index (BMI) 36.0-36.9, adult: Secondary | ICD-10-CM | POA: Diagnosis not present

## 2022-03-27 DIAGNOSIS — B9689 Other specified bacterial agents as the cause of diseases classified elsewhere: Secondary | ICD-10-CM | POA: Diagnosis not present

## 2022-03-27 DIAGNOSIS — N644 Mastodynia: Secondary | ICD-10-CM | POA: Diagnosis not present

## 2022-03-27 DIAGNOSIS — N760 Acute vaginitis: Secondary | ICD-10-CM | POA: Diagnosis not present

## 2022-04-02 DIAGNOSIS — R928 Other abnormal and inconclusive findings on diagnostic imaging of breast: Secondary | ICD-10-CM | POA: Diagnosis not present

## 2022-04-02 DIAGNOSIS — N6489 Other specified disorders of breast: Secondary | ICD-10-CM | POA: Diagnosis not present

## 2022-04-02 DIAGNOSIS — N644 Mastodynia: Secondary | ICD-10-CM | POA: Diagnosis not present

## 2022-04-24 DIAGNOSIS — N6489 Other specified disorders of breast: Secondary | ICD-10-CM | POA: Diagnosis not present

## 2022-04-24 DIAGNOSIS — N644 Mastodynia: Secondary | ICD-10-CM | POA: Diagnosis not present

## 2022-04-24 DIAGNOSIS — N611 Abscess of the breast and nipple: Secondary | ICD-10-CM | POA: Diagnosis not present

## 2022-05-27 DIAGNOSIS — N611 Abscess of the breast and nipple: Secondary | ICD-10-CM | POA: Diagnosis not present

## 2022-05-27 DIAGNOSIS — Z1231 Encounter for screening mammogram for malignant neoplasm of breast: Secondary | ICD-10-CM | POA: Diagnosis not present

## 2022-06-02 DIAGNOSIS — N611 Abscess of the breast and nipple: Secondary | ICD-10-CM | POA: Diagnosis not present

## 2022-06-12 DIAGNOSIS — N611 Abscess of the breast and nipple: Secondary | ICD-10-CM | POA: Diagnosis not present

## 2022-06-12 DIAGNOSIS — N6041 Mammary duct ectasia of right breast: Secondary | ICD-10-CM | POA: Diagnosis not present

## 2022-06-12 DIAGNOSIS — N61 Mastitis without abscess: Secondary | ICD-10-CM | POA: Diagnosis not present

## 2022-06-12 DIAGNOSIS — N6489 Other specified disorders of breast: Secondary | ICD-10-CM | POA: Diagnosis not present

## 2022-07-03 ENCOUNTER — Encounter (HOSPITAL_COMMUNITY): Payer: Self-pay | Admitting: Emergency Medicine

## 2022-07-03 ENCOUNTER — Other Ambulatory Visit: Payer: Self-pay

## 2022-07-03 ENCOUNTER — Emergency Department (HOSPITAL_COMMUNITY)
Admission: EM | Admit: 2022-07-03 | Discharge: 2022-07-03 | Disposition: A | Discharge status: Home / Self Care | Payer: Medicaid Other | Attending: Emergency Medicine | Admitting: Emergency Medicine

## 2022-07-03 DIAGNOSIS — N644 Mastodynia: Secondary | ICD-10-CM | POA: Insufficient documentation

## 2022-07-03 DIAGNOSIS — Z91040 Latex allergy status: Secondary | ICD-10-CM | POA: Diagnosis not present

## 2022-07-03 DIAGNOSIS — T889XXA Complication of surgical and medical care, unspecified, initial encounter: Secondary | ICD-10-CM | POA: Diagnosis present

## 2022-07-03 DIAGNOSIS — Y712 Prosthetic and other implants, materials and accessory cardiovascular devices associated with adverse incidents: Secondary | ICD-10-CM | POA: Insufficient documentation

## 2022-07-03 DIAGNOSIS — T8141XA Infection following a procedure, superficial incisional surgical site, initial encounter: Secondary | ICD-10-CM

## 2022-07-03 DIAGNOSIS — N61 Mastitis without abscess: Secondary | ICD-10-CM | POA: Diagnosis not present

## 2022-07-03 LAB — CBC WITH DIFFERENTIAL/PLATELET
Abs Immature Granulocytes: 0.04 10*3/uL (ref 0.00–0.07)
Basophils Absolute: 0.1 10*3/uL (ref 0.0–0.1)
Basophils Relative: 1 %
Eosinophils Absolute: 0.2 10*3/uL (ref 0.0–0.5)
Eosinophils Relative: 2 %
HCT: 35.7 % — ABNORMAL LOW (ref 36.0–46.0)
Hemoglobin: 11.1 g/dL — ABNORMAL LOW (ref 12.0–15.0)
Immature Granulocytes: 0 %
Lymphocytes Relative: 26 %
Lymphs Abs: 2.8 10*3/uL (ref 0.7–4.0)
MCH: 26.2 pg (ref 26.0–34.0)
MCHC: 31.1 g/dL (ref 30.0–36.0)
MCV: 84.2 fL (ref 80.0–100.0)
Monocytes Absolute: 0.5 10*3/uL (ref 0.1–1.0)
Monocytes Relative: 5 %
Neutro Abs: 7 10*3/uL (ref 1.7–7.7)
Neutrophils Relative %: 66 %
Platelets: 353 10*3/uL (ref 150–400)
RBC: 4.24 MIL/uL (ref 3.87–5.11)
RDW: 15.1 % (ref 11.5–15.5)
WBC: 10.6 10*3/uL — ABNORMAL HIGH (ref 4.0–10.5)
nRBC: 0 % (ref 0.0–0.2)

## 2022-07-03 LAB — COMPREHENSIVE METABOLIC PANEL
ALT: 11 U/L (ref 0–44)
AST: 11 U/L — ABNORMAL LOW (ref 15–41)
Albumin: 3.5 g/dL (ref 3.5–5.0)
Alkaline Phosphatase: 87 U/L (ref 38–126)
Anion gap: 7 (ref 5–15)
BUN: 9 mg/dL (ref 6–20)
CO2: 25 mmol/L (ref 22–32)
Calcium: 9 mg/dL (ref 8.9–10.3)
Chloride: 110 mmol/L (ref 98–111)
Creatinine, Ser: 0.75 mg/dL (ref 0.44–1.00)
GFR, Estimated: >60 mL/min (ref >60)
Glucose, Bld: 95 mg/dL (ref 70–99)
Potassium: 4.1 mmol/L (ref 3.5–5.1)
Sodium: 142 mmol/L (ref 135–145)
Total Bilirubin: 0.3 mg/dL (ref 0.3–1.2)
Total Protein: 6.5 g/dL (ref 6.5–8.1)

## 2022-07-03 LAB — I-STAT BETA HCG BLOOD, ED (MC, WL, AP ONLY): I-stat hCG, quantitative: <5 m[IU]/mL (ref <5)

## 2022-07-03 MED ORDER — CLINDAMYCIN HCL 300 MG PO CAPS: 300.0000 mg | ORAL_CAPSULE | Freq: Three times a day (TID) | ORAL | 0 refills | Status: AC

## 2022-07-03 MED ORDER — HYDROCODONE-ACETAMINOPHEN 5-325 MG PO TABS: 1.0000 | ORAL_TABLET | ORAL | 0 refills | Status: AC | PRN

## 2022-07-03 MED ORDER — CEFTRIAXONE SODIUM 1 G IJ SOLR
1.0000 g | Freq: Once | INTRAMUSCULAR | Status: AC
  Administered 2022-07-03: 1 g via INTRAMUSCULAR
  Filled 2022-07-03 (×2): qty 10

## 2022-07-03 MED ORDER — HYDROCODONE-ACETAMINOPHEN 5-325 MG PO TABS
1.0000 | ORAL_TABLET | Freq: Once | ORAL | Status: AC
  Administered 2022-07-03: 1 via ORAL
  Filled 2022-07-03: qty 1

## 2022-07-03 MED ORDER — LIDOCAINE HCL (PF) 1 % IJ SOLN
INTRAMUSCULAR | Status: AC
  Administered 2022-07-03: 2.1 mL
  Filled 2022-07-03: qty 5

## 2022-07-03 NOTE — ED Provider Notes (Signed)
[facility] EMERGENCY DEPARTMENT Provider Note   CSN: [id number] Arrival date & time: 07/03/22  0825     History  Chief Complaint  Patient presents with   Post-op Problem    Monica Owens is a 36 y.o. female.  36 year old female presents with complaint of pain and redness in the right breast. Reports having a lump for sometime in the breast, was seen by surgeon with [facility] who excised the lump and placed her on antibiotics on 06/12/22. Patient followed up in office on 06/23/22 where she was told everything appeared to be healing well and her sutures will be left in place for an additional 2 weeks.  Patient states she feels like the redness, swelling and pain are progressively worsening and is concerned she has an infection.  No fevers or chills.  Patient called her surgeon's office however they have not called her back.  She is scheduled to be seen again in clinic tomorrow.       Home Medications Prior to Admission medications   Medication Sig Start Date End Date Taking? Authorizing Provider  clindamycin (CLEOCIN) 300 MG capsule Take 1 capsule (300 mg total) by mouth 3 (three) times daily for 10 days. 07/03/22 07/13/22 Yes Jeannie Fend, PA-C  HYDROcodone-acetaminophen (NORCO/VICODIN) 5-325 MG tablet Take 1 tablet by mouth every 4 (four) hours as needed. 07/03/22  Yes Jeannie Fend, PA-C  cetirizine (ZYRTEC) 10 MG tablet Take 10 mg by mouth at bedtime as needed for allergies.  08/25/18   [provider]  hydrOXYzine (VISTARIL) 25 MG capsule Take 25 mg by mouth 3 (three) times daily as needed for anxiety.  08/25/18   [provider]  ondansetron (ZOFRAN) 4 MG tablet Take 1 tablet (4 mg total) by mouth every 8 (eight) hours as needed for nausea or vomiting. 10/26/18   Couture, Cortni S, PA-C      Allergies    Latex    Review of Systems   Review of Systems Negative except as per HPI Physical Exam Updated Vital Signs BP 132/79 (BP Location:  Right Arm)   Pulse (!) 55   Temp 98.5 F (36.9 C) (Oral)   Resp 16   SpO2 100%  Physical Exam Vitals and nursing note reviewed.  Constitutional:      General: She is not in acute distress.    Appearance: She is well-developed. She is not diaphoretic.  HENT:     Head: Normocephalic and atraumatic.  Pulmonary:     Effort: Pulmonary effort is normal.  Chest:       Comments: Sutures in place Skin:    General: Skin is warm and dry.  Neurological:     Mental Status: She is alert and oriented to person, place, and time.  Psychiatric:        Behavior: Behavior normal.     ED Results / Procedures / Treatments   Labs (all labs ordered are listed, but only abnormal results are displayed) Labs Reviewed  COMPREHENSIVE METABOLIC PANEL - Abnormal; Notable for the following components:      Result Value   AST 11 (*)    All other components within normal limits  CBC WITH DIFFERENTIAL/PLATELET - Abnormal; Notable for the following components:   WBC 10.6 (*)    Hemoglobin 11.1 (*)    HCT 35.7 (*)    All other components within normal limits  I-STAT BETA HCG BLOOD, ED ([facility], [facility], [facility] ONLY)    EKG None  Radiology No results found.  Procedures Procedures    Medications Ordered in ED Medications  cefTRIAXone (ROCEPHIN) injection 1 g (1 g Intramuscular Given 07/03/22 0943)  HYDROcodone-acetaminophen (NORCO/VICODIN) 5-325 MG per tablet 1 tablet (1 tablet Oral Given 07/03/22 0942)  lidocaine (PF) (XYLOCAINE) 1 % injection (2.1 mLs  Given 07/03/22 0944)    ED Course/ Medical Decision Making/ A&P                           Medical Decision Making Amount and/or Complexity of Data Reviewed Labs: ordered.  Risk Prescription drug management.   36 year old female presents with concern for infection in her right breast after having surgery last month.  Area does appear erythematous and indurated, question fluctuance in the area of her recent incision without any active drainage.  Postop  follow-up visit from June 23, 2022 reviewed in care everywhere, her exam does appear consistent somewhat with documentation at that visit, at that time it was felt her skin changes were related to inflammatory changes and not infection.  Her cultures obtained in the OR were negative for growth.  Patient is afebrile here.  She is having significant pain worsening, I am concerned that she may have an infection.  Will cover with antibiotics, also given narcotic pain medication.  She is advised to follow-up as scheduled tomorrow with her surgeon and to return to ER should she develop fever or worsening symptoms.        Final Clinical Impression(s) / ED Diagnoses Final diagnoses:  Infection of superficial incisional surgical site after procedure, initial encounter    Rx / DC Orders ED Discharge Orders          Ordered    clindamycin (CLEOCIN) 300 MG capsule  3 times daily        07/03/22 0933    HYDROcodone-acetaminophen (NORCO/VICODIN) 5-325 MG tablet  Every 4 hours PRN        07/03/22 0933              Jeannie Fend, PA-C 07/03/22 1450    Linwood Dibbles, MD 07/05/22 1249

## 2022-07-03 NOTE — ED Triage Notes (Signed)
Patient recently had a lumpectomy on the 27th of July  to R breast and has been experiencing pain to the breast/ incision area. Procedure done by Dr.Arrodondo at [facility]. Patient concerned because R breast visually looks red and swollen and has noticed green discharge to the area. She has been following up with the office as she should and has an appointment scheduled for tomorrow, however the pain is bothering her so she wanted to come to the ED for eval. Patient denies fevers, chills at home, n/v/d, chest pain, abdominal pain. AOX4.

## 2022-07-03 NOTE — Discharge Instructions (Signed)
Follow up with your surgeon as scheduled. Return to the ER for fevers, worsening or concerning symptoms. Take antibiotics as prescribed. Apply warm compresses to area for 20 minutes at a time. Limited use of Norco for severe pain.

## 2022-07-04 DIAGNOSIS — N641 Fat necrosis of breast: Secondary | ICD-10-CM | POA: Diagnosis not present

## 2022-07-04 DIAGNOSIS — N611 Abscess of the breast and nipple: Secondary | ICD-10-CM | POA: Diagnosis not present

## 2022-08-04 DIAGNOSIS — Z113 Encounter for screening for infections with a predominantly sexual mode of transmission: Secondary | ICD-10-CM | POA: Diagnosis not present

## 2022-09-15 DIAGNOSIS — Z9889 Other specified postprocedural states: Secondary | ICD-10-CM | POA: Diagnosis not present

## 2022-09-15 DIAGNOSIS — Z87410 Personal history of cervical dysplasia: Secondary | ICD-10-CM | POA: Diagnosis not present

## 2022-09-15 DIAGNOSIS — Z0000 Encounter for general adult medical examination without abnormal findings: Secondary | ICD-10-CM | POA: Diagnosis not present

## 2022-09-15 DIAGNOSIS — Z01419 Encounter for gynecological examination (general) (routine) without abnormal findings: Secondary | ICD-10-CM | POA: Diagnosis not present

## 2022-09-15 DIAGNOSIS — Z1151 Encounter for screening for human papillomavirus (HPV): Secondary | ICD-10-CM | POA: Diagnosis not present

## 2022-09-22 DIAGNOSIS — Z872 Personal history of diseases of the skin and subcutaneous tissue: Secondary | ICD-10-CM | POA: Diagnosis not present

## 2022-09-22 DIAGNOSIS — Z803 Family history of malignant neoplasm of breast: Secondary | ICD-10-CM | POA: Diagnosis not present

## 2022-10-03 DIAGNOSIS — Z872 Personal history of diseases of the skin and subcutaneous tissue: Secondary | ICD-10-CM | POA: Diagnosis not present

## 2022-10-03 DIAGNOSIS — Z803 Family history of malignant neoplasm of breast: Secondary | ICD-10-CM | POA: Diagnosis not present

## 2022-12-30 DIAGNOSIS — Z113 Encounter for screening for infections with a predominantly sexual mode of transmission: Secondary | ICD-10-CM | POA: Diagnosis not present

## 2023-03-10 DIAGNOSIS — F4323 Adjustment disorder with mixed anxiety and depressed mood: Secondary | ICD-10-CM | POA: Diagnosis not present

## 2023-03-24 DIAGNOSIS — B3731 Acute candidiasis of vulva and vagina: Secondary | ICD-10-CM | POA: Diagnosis not present

## 2023-03-24 DIAGNOSIS — N898 Other specified noninflammatory disorders of vagina: Secondary | ICD-10-CM | POA: Diagnosis not present

## 2023-04-15 DIAGNOSIS — N611 Abscess of the breast and nipple: Secondary | ICD-10-CM | POA: Diagnosis not present

## 2023-04-15 DIAGNOSIS — N6011 Diffuse cystic mastopathy of right breast: Secondary | ICD-10-CM | POA: Diagnosis not present

## 2023-05-01 DIAGNOSIS — N6011 Diffuse cystic mastopathy of right breast: Secondary | ICD-10-CM | POA: Diagnosis not present

## 2023-05-01 DIAGNOSIS — N611 Abscess of the breast and nipple: Secondary | ICD-10-CM | POA: Diagnosis not present

## 2023-05-04 DIAGNOSIS — N611 Abscess of the breast and nipple: Secondary | ICD-10-CM | POA: Diagnosis not present

## 2023-07-06 DIAGNOSIS — M76822 Posterior tibial tendinitis, left leg: Secondary | ICD-10-CM | POA: Diagnosis not present

## 2023-07-13 DIAGNOSIS — Z803 Family history of malignant neoplasm of breast: Secondary | ICD-10-CM | POA: Diagnosis not present

## 2023-07-13 DIAGNOSIS — N611 Abscess of the breast and nipple: Secondary | ICD-10-CM | POA: Diagnosis not present

## 2023-07-13 DIAGNOSIS — N6011 Diffuse cystic mastopathy of right breast: Secondary | ICD-10-CM | POA: Diagnosis not present

## 2023-07-23 DIAGNOSIS — N611 Abscess of the breast and nipple: Secondary | ICD-10-CM | POA: Diagnosis not present

## 2023-07-27 DIAGNOSIS — Z6837 Body mass index (BMI) 37.0-37.9, adult: Secondary | ICD-10-CM | POA: Diagnosis not present

## 2023-07-27 DIAGNOSIS — R7303 Prediabetes: Secondary | ICD-10-CM | POA: Diagnosis not present

## 2023-07-27 DIAGNOSIS — N898 Other specified noninflammatory disorders of vagina: Secondary | ICD-10-CM | POA: Diagnosis not present

## 2023-09-23 DIAGNOSIS — N926 Irregular menstruation, unspecified: Secondary | ICD-10-CM | POA: Diagnosis not present

## 2023-10-12 ENCOUNTER — Other Ambulatory Visit (HOSPITAL_COMMUNITY): Payer: Self-pay

## 2023-10-12 MED ORDER — WEGOVY 1 MG/0.5ML ~~LOC~~ SOAJ
1.0000 mg | SUBCUTANEOUS | 0 refills | Status: DC
  Filled 2023-10-12: qty 2, 28d supply, fill #0

## 2023-10-22 DIAGNOSIS — N870 Mild cervical dysplasia: Secondary | ICD-10-CM | POA: Diagnosis not present

## 2023-10-22 DIAGNOSIS — N926 Irregular menstruation, unspecified: Secondary | ICD-10-CM | POA: Diagnosis not present

## 2023-10-22 DIAGNOSIS — Z113 Encounter for screening for infections with a predominantly sexual mode of transmission: Secondary | ICD-10-CM | POA: Diagnosis not present

## 2023-10-22 DIAGNOSIS — Z0000 Encounter for general adult medical examination without abnormal findings: Secondary | ICD-10-CM | POA: Diagnosis not present

## 2023-10-22 DIAGNOSIS — Z9889 Other specified postprocedural states: Secondary | ICD-10-CM | POA: Diagnosis not present

## 2023-10-22 DIAGNOSIS — Z01419 Encounter for gynecological examination (general) (routine) without abnormal findings: Secondary | ICD-10-CM | POA: Diagnosis not present

## 2023-11-05 ENCOUNTER — Other Ambulatory Visit (HOSPITAL_COMMUNITY): Payer: Self-pay

## 2023-11-05 DIAGNOSIS — Z6837 Body mass index (BMI) 37.0-37.9, adult: Secondary | ICD-10-CM | POA: Diagnosis not present

## 2023-11-05 DIAGNOSIS — E66812 Obesity, class 2: Secondary | ICD-10-CM | POA: Diagnosis not present

## 2023-11-05 MED ORDER — WEGOVY 1.7 MG/0.75ML ~~LOC~~ SOAJ
1.7000 mg | SUBCUTANEOUS | 0 refills | Status: DC
  Filled 2023-11-05: qty 3, 28d supply, fill #0

## 2023-12-04 ENCOUNTER — Other Ambulatory Visit (HOSPITAL_COMMUNITY): Payer: Self-pay

## 2023-12-04 MED ORDER — WEGOVY 2.4 MG/0.75ML ~~LOC~~ SOAJ
2.4000 mg | SUBCUTANEOUS | 1 refills | Status: DC
  Filled 2023-12-04: qty 3, 28d supply, fill #0
  Filled 2023-12-29: qty 3, 28d supply, fill #1

## 2024-01-05 ENCOUNTER — Other Ambulatory Visit (HOSPITAL_COMMUNITY): Payer: Self-pay

## 2024-01-27 ENCOUNTER — Other Ambulatory Visit (HOSPITAL_COMMUNITY): Payer: Self-pay

## 2024-01-28 ENCOUNTER — Other Ambulatory Visit (HOSPITAL_COMMUNITY): Payer: Self-pay

## 2024-02-01 ENCOUNTER — Other Ambulatory Visit (HOSPITAL_COMMUNITY): Payer: Self-pay

## 2024-02-01 MED ORDER — WEGOVY 2.4 MG/0.75ML ~~LOC~~ SOAJ
2.4000 mg | SUBCUTANEOUS | 1 refills | Status: DC
  Filled 2024-02-01: qty 3, 28d supply, fill #0
  Filled 2024-03-02: qty 3, 28d supply, fill #1

## 2024-02-08 DIAGNOSIS — E66811 Obesity, class 1: Secondary | ICD-10-CM | POA: Diagnosis not present

## 2024-02-08 DIAGNOSIS — E6609 Other obesity due to excess calories: Secondary | ICD-10-CM | POA: Diagnosis not present

## 2024-02-08 DIAGNOSIS — Z6833 Body mass index (BMI) 33.0-33.9, adult: Secondary | ICD-10-CM | POA: Diagnosis not present

## 2024-03-02 ENCOUNTER — Other Ambulatory Visit (HOSPITAL_COMMUNITY): Payer: Self-pay

## 2024-03-29 DIAGNOSIS — N760 Acute vaginitis: Secondary | ICD-10-CM | POA: Diagnosis not present

## 2024-03-29 DIAGNOSIS — B9689 Other specified bacterial agents as the cause of diseases classified elsewhere: Secondary | ICD-10-CM | POA: Diagnosis not present

## 2024-05-09 DIAGNOSIS — N61 Mastitis without abscess: Secondary | ICD-10-CM | POA: Diagnosis not present

## 2024-05-31 DIAGNOSIS — N611 Abscess of the breast and nipple: Secondary | ICD-10-CM | POA: Diagnosis not present

## 2024-05-31 DIAGNOSIS — N61 Mastitis without abscess: Secondary | ICD-10-CM | POA: Diagnosis not present

## 2024-06-01 DIAGNOSIS — N611 Abscess of the breast and nipple: Secondary | ICD-10-CM | POA: Diagnosis not present

## 2024-06-17 DIAGNOSIS — N61 Mastitis without abscess: Secondary | ICD-10-CM | POA: Diagnosis not present

## 2024-06-17 DIAGNOSIS — N611 Abscess of the breast and nipple: Secondary | ICD-10-CM | POA: Diagnosis not present

## 2024-07-20 DIAGNOSIS — N61 Mastitis without abscess: Secondary | ICD-10-CM | POA: Diagnosis not present

## 2024-07-20 DIAGNOSIS — N611 Abscess of the breast and nipple: Secondary | ICD-10-CM | POA: Diagnosis not present

## 2024-07-20 DIAGNOSIS — N640 Fissure and fistula of nipple: Secondary | ICD-10-CM | POA: Diagnosis not present

## 2024-08-02 DIAGNOSIS — N611 Abscess of the breast and nipple: Secondary | ICD-10-CM | POA: Diagnosis not present

## 2024-08-02 DIAGNOSIS — N61 Mastitis without abscess: Secondary | ICD-10-CM | POA: Diagnosis not present

## 2024-08-16 DIAGNOSIS — N61 Mastitis without abscess: Secondary | ICD-10-CM | POA: Diagnosis not present

## 2024-08-30 DIAGNOSIS — N61 Mastitis without abscess: Secondary | ICD-10-CM | POA: Diagnosis not present

## 2024-10-10 DIAGNOSIS — Z113 Encounter for screening for infections with a predominantly sexual mode of transmission: Secondary | ICD-10-CM | POA: Diagnosis not present

## 2024-10-10 DIAGNOSIS — Z6829 Body mass index (BMI) 29.0-29.9, adult: Secondary | ICD-10-CM | POA: Diagnosis not present

## 2024-10-10 DIAGNOSIS — N760 Acute vaginitis: Secondary | ICD-10-CM | POA: Diagnosis not present

## 2024-10-10 DIAGNOSIS — E663 Overweight: Secondary | ICD-10-CM | POA: Diagnosis not present

## 2024-10-10 DIAGNOSIS — B3731 Acute candidiasis of vulva and vagina: Secondary | ICD-10-CM | POA: Diagnosis not present

## 2024-10-10 DIAGNOSIS — B9689 Other specified bacterial agents as the cause of diseases classified elsewhere: Secondary | ICD-10-CM | POA: Diagnosis not present
